# Patient Record
Sex: Male | Born: 1990 | Race: Black or African American | Hispanic: No | Marital: Single | Smoking: Current some day smoker
Health system: Southern US, Community
[De-identification: ages and names within clinical notes are randomized; demographics above are authoritative.]

---
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
## 2013-01-11 ENCOUNTER — Emergency Department: Payer: Self-pay | Admitting: Emergency Medicine

## 2013-01-14 LAB — BETA STREP CULTURE(ARMC)

## 2013-08-24 ENCOUNTER — Emergency Department: Payer: Self-pay | Admitting: Emergency Medicine

## 2014-11-26 ENCOUNTER — Encounter: Payer: Self-pay | Admitting: Emergency Medicine

## 2014-11-26 ENCOUNTER — Emergency Department
Admission: EM | Admit: 2014-11-26 | Discharge: 2014-11-26 | Disposition: A | Discharge status: Home / Self Care | Payer: Self-pay | Attending: Emergency Medicine | Admitting: Emergency Medicine

## 2014-11-26 DIAGNOSIS — Z720 Tobacco use: Secondary | ICD-10-CM | POA: Insufficient documentation

## 2014-11-26 DIAGNOSIS — L0201 Cutaneous abscess of face: Secondary | ICD-10-CM | POA: Insufficient documentation

## 2014-11-26 MED ORDER — LIDOCAINE-EPINEPHRINE 1 %-1:100000 IJ SOLN
1.3000 mL | Freq: Once | INTRAMUSCULAR | Status: DC
  Filled 2014-11-26: qty 1.3

## 2014-11-26 MED ORDER — SULFAMETHOXAZOLE-TRIMETHOPRIM 800-160 MG PO TABS
ORAL_TABLET | ORAL | Status: AC
  Administered 2014-11-26: 1 via ORAL
  Filled 2014-11-26: qty 1

## 2014-11-26 MED ORDER — LIDOCAINE-EPINEPHRINE 2 %-1:100000 IJ SOLN
INTRAMUSCULAR | Status: AC
  Filled 2014-11-26: qty 1.7

## 2014-11-26 MED ORDER — SULFAMETHOXAZOLE-TRIMETHOPRIM 800-160 MG PO TABS: 1.0000 | ORAL_TABLET | Freq: Two times a day (BID) | ORAL | Status: AC

## 2014-11-26 MED ORDER — SULFAMETHOXAZOLE-TRIMETHOPRIM 800-160 MG PO TABS
1.0000 | ORAL_TABLET | Freq: Once | ORAL | Status: AC
  Administered 2014-11-26: 1 via ORAL

## 2014-11-26 NOTE — ED Provider Notes (Signed)
CSN: [id number]     Arrival date & time 11/26/14  1908 History   First MD Initiated Contact with Patient 11/26/14 2048     Chief Complaint  Patient presents with  . Abscess     (Consider location/radiation/quality/duration/timing/severity/associated sxs/prior Treatment) HPI   24 year old male with 3-4 day history of left cheek swelling. Patient has a palpable tender mass that is red hot and swollen. He denies any drainage. No fevers numbness or tingling. No chest pain or shortness of breath or difficult swallowing. He has had left cervical anterior lymphadenopathy pain is 4 out of 10 described as a constant ache. No radiation of pain.  History reviewed. No pertinent past medical history. History reviewed. No pertinent past surgical history. No family history on file. History  Substance Use Topics  . Smoking status: Current Every Day Smoker  . Smokeless tobacco: Not on file  . Alcohol Use: No    Review of Systems  Constitutional: Negative.  Negative for fever, chills, activity change and appetite change.  HENT: Negative for congestion, ear pain, mouth sores, rhinorrhea, sinus pressure, sore throat and trouble swallowing.   Eyes: Negative for photophobia, pain and discharge.  Respiratory: Negative for cough, chest tightness and shortness of breath.   Cardiovascular: Negative for chest pain and leg swelling.  Gastrointestinal: Negative for nausea, vomiting, abdominal pain, diarrhea and abdominal distention.  Genitourinary: Negative for dysuria and difficulty urinating.  Musculoskeletal: Negative for back pain, arthralgias and gait problem.  Skin: Positive for wound (abscess left cheek). Negative for color change and rash.  Neurological: Negative for dizziness and headaches.  Hematological: Negative for adenopathy.  Psychiatric/Behavioral: Negative for behavioral problems and agitation.      Allergies  Review of patient's allergies indicates no known allergies.  Home  Medications   Prior to Admission medications   Not on File   BP 127/56 mmHg  Pulse 72  Temp(Src) 98.3 F (36.8 C) (Oral)  Resp 18  Ht 5\' 11"  (1.803 m)  Wt 137 lb (62.143 kg)  BMI 19.12 kg/m2  SpO2 99% Physical Exam  Constitutional: He is oriented to person, place, and time. He appears well-developed and well-nourished.  HENT:  Head: Normocephalic and atraumatic.  Eyes: Conjunctivae and EOM are normal. Pupils are equal, round, and reactive to light.  Neck: Normal range of motion. Neck supple.  Cardiovascular: Normal rate, regular rhythm, normal heart sounds and intact distal pulses.   Pulmonary/Chest: Effort normal and breath sounds normal. No respiratory distress. He has no wheezes. He has no rales. He exhibits no tenderness.  Abdominal: Soft. Bowel sounds are normal.  Musculoskeletal: Normal range of motion. He exhibits no edema or tenderness.  Lymphadenopathy:    He has cervical adenopathy (left).  Neurological: He is alert and oriented to person, place, and time. No cranial nerve deficit.  Skin: Skin is warm and dry. There is erythema (2 x 2 centimeter area of fluctuance that is superficial on the left cheek. No active drainage. Area of fluctuance is tender. No oral drainage or tooth pain).  Psychiatric: He has a normal mood and affect. His behavior is normal. Judgment and thought content normal.    ED Course  Procedures (including critical care time)  INCISION AND DRAINAGE Performed by: Patience MuscaGAINES, THOMAS CHRISTOPHER Consent: Verbal consent obtained. Risks and benefits: risks, benefits and alternatives were discussed Type: abscess  Body area: Left cheek  Anesthesia: local infiltration  Incision was made with a scalpel.  Local anesthetic: lidocaine 1% % with epinephrine  Anesthetic total:  1 ml  Complexity: complex Blunt dissection to break up loculations  Drainage: purulent  Drainage amount: Mild, 2 ML's   Packing material: None   Patient tolerance: Patient  tolerated the procedure well with no immediate complications.  Skin cleansed with Betadine before and after procedure  Labs Review Labs Reviewed - No data to display  Imaging Review No results found.   EKG Interpretation None      MDM   Final diagnoses:  Facial abscess    Patient with left facial abscess, superficial. Incision and drainage was performed. Patient tolerated procedure well. Moderate drainage was removed from the abscess. Patient was started on anti-biotics. Follow-up with 2-3 days for recheck.    Evon Slack, PA-C 12/25/14 [id number]  Evon Slack, PA-C 02/22/15 1205  Myrna Blazer, MD 02/22/15 1409

## 2014-11-26 NOTE — Discharge Instructions (Signed)

## 2014-11-26 NOTE — ED Notes (Signed)
Triage completed by Malachi BondsIda, RN

## 2014-11-26 NOTE — ED Notes (Signed)
Pt presents to ED with c/o abscess to left cheek. Pt reports that he began to notice swelling about two days ago. Pt is awake and alert during triage.

## 2015-09-07 ENCOUNTER — Encounter: Payer: Self-pay | Admitting: Emergency Medicine

## 2015-09-07 ENCOUNTER — Encounter: Payer: Self-pay | Admitting: Psychiatry

## 2015-09-07 ENCOUNTER — Inpatient Hospital Stay
Admission: RE | Admit: 2015-09-07 | Discharge: 2015-09-09 | DRG: 885 | Disposition: A | Discharge status: Home / Self Care | Payer: No Typology Code available for payment source | Source: Intra-hospital | Attending: Psychiatry | Admitting: Psychiatry

## 2015-09-07 ENCOUNTER — Emergency Department
Admission: EM | Admit: 2015-09-07 | Discharge: 2015-09-07 | Disposition: A | Discharge status: Other Acute Inpatient Hospital | Payer: Self-pay | Attending: Student | Admitting: Student

## 2015-09-07 DIAGNOSIS — F1210 Cannabis abuse, uncomplicated: Secondary | ICD-10-CM | POA: Insufficient documentation

## 2015-09-07 DIAGNOSIS — F332 Major depressive disorder, recurrent severe without psychotic features: Secondary | ICD-10-CM

## 2015-09-07 DIAGNOSIS — F17210 Nicotine dependence, cigarettes, uncomplicated: Secondary | ICD-10-CM | POA: Diagnosis present

## 2015-09-07 DIAGNOSIS — F32A Depression, unspecified: Secondary | ICD-10-CM

## 2015-09-07 DIAGNOSIS — F1010 Alcohol abuse, uncomplicated: Secondary | ICD-10-CM | POA: Diagnosis present

## 2015-09-07 DIAGNOSIS — R45851 Suicidal ideations: Secondary | ICD-10-CM

## 2015-09-07 DIAGNOSIS — F17200 Nicotine dependence, unspecified, uncomplicated: Secondary | ICD-10-CM | POA: Diagnosis present

## 2015-09-07 DIAGNOSIS — F329 Major depressive disorder, single episode, unspecified: Secondary | ICD-10-CM | POA: Insufficient documentation

## 2015-09-07 LAB — COMPREHENSIVE METABOLIC PANEL
ALBUMIN: 4.2 g/dL (ref 3.5–5.0)
ALT: 13 U/L — ABNORMAL LOW (ref 17–63)
ANION GAP: 7 (ref 5–15)
AST: 20 U/L (ref 15–41)
Alkaline Phosphatase: 70 U/L (ref 38–126)
BUN: 12 mg/dL (ref 6–20)
CHLORIDE: 106 mmol/L (ref 101–111)
CO2: 26 mmol/L (ref 22–32)
Calcium: 9 mg/dL (ref 8.9–10.3)
Creatinine, Ser: 0.75 mg/dL (ref 0.61–1.24)
GFR calc Af Amer: >60 mL/min (ref >60)
GFR calc non Af Amer: >60 mL/min (ref >60)
GLUCOSE: 99 mg/dL (ref 65–99)
POTASSIUM: 3.6 mmol/L (ref 3.5–5.1)
SODIUM: 139 mmol/L (ref 135–145)
Total Bilirubin: 0.9 mg/dL (ref 0.3–1.2)
Total Protein: 6.8 g/dL (ref 6.5–8.1)

## 2015-09-07 LAB — ACETAMINOPHEN LEVEL

## 2015-09-07 LAB — ETHANOL: Alcohol, Ethyl (B): <5 mg/dL (ref <5)

## 2015-09-07 LAB — URINE DRUG SCREEN, QUALITATIVE (ARMC ONLY)
AMPHETAMINES, UR SCREEN: NOT DETECTED
Barbiturates, Ur Screen: NOT DETECTED
Benzodiazepine, Ur Scrn: NOT DETECTED
Cannabinoid 50 Ng, Ur ~~LOC~~: POSITIVE — AB
Cocaine Metabolite,Ur ~~LOC~~: NOT DETECTED
MDMA (ECSTASY) UR SCREEN: NOT DETECTED
METHADONE SCREEN, URINE: NOT DETECTED
OPIATE, UR SCREEN: NOT DETECTED
PHENCYCLIDINE (PCP) UR S: NOT DETECTED
Tricyclic, Ur Screen: NOT DETECTED

## 2015-09-07 LAB — CBC
HEMATOCRIT: 42.2 % (ref 40.0–52.0)
Hemoglobin: 14.4 g/dL (ref 13.0–18.0)
MCH: 30.6 pg (ref 26.0–34.0)
MCHC: 34.1 g/dL (ref 32.0–36.0)
MCV: 89.6 fL (ref 80.0–100.0)
PLATELETS: 274 10*3/uL (ref 150–440)
RBC: 4.71 MIL/uL (ref 4.40–5.90)
RDW: 13.4 % (ref 11.5–14.5)
WBC: 6.2 10*3/uL (ref 3.8–10.6)

## 2015-09-07 LAB — TSH: TSH: 1.479 u[IU]/mL (ref 0.350–4.500)

## 2015-09-07 LAB — SALICYLATE LEVEL: Salicylate Lvl: <4 mg/dL (ref 2.8–30.0)

## 2015-09-07 MED ORDER — MIRTAZAPINE 15 MG PO TABS: 15.0000 mg | ORAL_TABLET | Freq: Every day | ORAL | Status: DC

## 2015-09-07 MED ORDER — ACETAMINOPHEN 325 MG PO TABS: 650.0000 mg | ORAL_TABLET | Freq: Four times a day (QID) | ORAL | Status: DC | PRN

## 2015-09-07 MED ORDER — MIRTAZAPINE 15 MG PO TABS
15.0000 mg | ORAL_TABLET | Freq: Every day | ORAL | Status: DC
  Administered 2015-09-07 – 2015-09-08 (×2): 15 mg via ORAL
  Filled 2015-09-07 (×2): qty 1

## 2015-09-07 MED ORDER — NICOTINE 21 MG/24HR TD PT24
21.0000 mg | MEDICATED_PATCH | Freq: Every day | TRANSDERMAL | Status: DC
  Administered 2015-09-07: 21 mg via TRANSDERMAL
  Filled 2015-09-07 (×2): qty 1

## 2015-09-07 MED ORDER — ALUM & MAG HYDROXIDE-SIMETH 200-200-20 MG/5ML PO SUSP: 30.0000 mL | ORAL | Status: DC | PRN

## 2015-09-07 MED ORDER — DIPHENHYDRAMINE HCL 25 MG PO CAPS: 50.0000 mg | ORAL_CAPSULE | Freq: Every evening | ORAL | Status: DC | PRN

## 2015-09-07 MED ORDER — DIPHENHYDRAMINE HCL 25 MG PO CAPS
50.0000 mg | ORAL_CAPSULE | Freq: Every evening | ORAL | Status: DC | PRN
  Administered 2015-09-08: 50 mg via ORAL
  Filled 2015-09-07: qty 2

## 2015-09-07 MED ORDER — MAGNESIUM HYDROXIDE 400 MG/5ML PO SUSP: 30.0000 mL | Freq: Every day | ORAL | Status: DC | PRN

## 2015-09-07 MED ORDER — CITALOPRAM HYDROBROMIDE 20 MG PO TABS: 20.0000 mg | ORAL_TABLET | Freq: Every day | ORAL | Status: DC

## 2015-09-07 NOTE — Plan of Care (Signed)
Problem: Ineffective individual coping Goal: LTG: Patient will report a decrease in negative feelings Outcome: Progressing Pt stated he was doing better due to having someone to talk to.   Problem: Diagnosis: Increased Risk For Suicide Attempt Goal: LTG-Patient Will Report Improved Mood and Deny Suicidal LTG (by discharge) Patient will report improved mood and deny suicidal ideation.  Outcome: Progressing Pt denies SI at this time

## 2015-09-07 NOTE — ED Notes (Signed)
Pt vol adm to [facility] has no c/o

## 2015-09-07 NOTE — BH Assessment (Signed)
Assessment Note  Derek Gardner is an 25 y.o. male who presents to the ER, VIA EMS, after calling them for help. Patient states, he has been depressed and having thoughts of harming his self. Patient initially stated he had not intent or plan but during the interview, he shared [place] when he started to have the thought of being hit by a train. Due to the thoughts and being afraid of what he may do, patient called 911 for help.  Patient current mood and mental state is due to the recent breakup with his girlfriend. They were together for approximately 3 years. Two days ago, they had a verbal altercation, which lead to the break up. Patient states, he hasn't talked with her since then. She hasn't returned his phone calls.  Patient is currently working and due to his current mood, it is affecting his quality of work.  Patient has no involvement with the legal system, he has no history of violence or aggression. He also reports of having no history of mental health or substance abuse treatment.    He admits to using alcohol and cannabis.   Patient denies HI and AV/H.  Diagnosis: Depression  Past Medical History: History reviewed. No pertinent past medical history.  History reviewed. No pertinent past surgical history.  Family History: History reviewed. No pertinent family history.  Social History:  reports that he has been smoking Cigarettes.  He has been smoking about 1.00 pack per day. He does not have any smokeless tobacco history on file. He reports that he uses illicit drugs (Marijuana). He reports that he does not drink alcohol.  Additional Social History:  Alcohol / Drug Use Pain Medications: See PTA Prescriptions: See PTA Over the Counter: See PTA History of alcohol / drug use?: Yes Longest period of sobriety (when/how long): "About a week." Negative Consequences of Use:  (Reports of none) Withdrawal Symptoms:  (Reports none) Substance  #1 Name of Substance 1: Cannabis 1 - Age of First Use: 15 1 - Amount (size/oz): "Maybe one or two blunts." 1 - Frequency: "May be 4" days out of the week. 1 - Duration: 9 years 1 - Last Use / Amount: 09/07/2015 Substance #2 Name of Substance 2: Alcohol 2 - Age of First Use: 14 2 - Amount (size/oz): "Like a pint of Hennessey or MJ"  2 - Frequency: 2 days out of the week 2 - Duration: "Going on two years or so." 2 - Last Use / Amount: 09/05/2015  CIWA: CIWA-Ar BP: 120/78 mmHg Pulse Rate: 71 COWS:    Allergies: No Known Allergies  Home Medications:  (Not in a hospital admission)  OB/GYN Status:  No LMP for male patient.  General Assessment Data [facility] Assessment: In system Is this a Tele or Face-to-Face Assessment?: Face-to-Face Is this an Initial Assessment or a Re-assessment for this encounter?: Initial Assessment Marital status: Single Maiden name: n/a Is patient pregnant?: No Pregnancy Status: No Living Arrangements: Parent Can pt return to current living arrangement?: Yes Admission Status: Voluntary Is patient capable of signing voluntary admission?: Yes Referral Source: Self/Family/Friend Insurance type: None  Medical Screening Exam [facility] ONLY) Medical Exam completed: Yes  Crisis Care Plan Living Arrangements: Parent Legal Guardian: Other: (None Reported) Name of Psychiatrist: None Reported Name of Therapist: None Reported  Education Status Is patient currently in school?: No Current Grade: n/a Highest grade of school patient has completed: High School Diploma  Name of school: [employer] person: n/a  Risk to self with the past 6 months Suicidal Ideation: Yes-Currently Present Has patient been a risk to self within the past 6 months prior to admission? : Yes Suicidal Intent: Yes-Currently Present Has patient had any suicidal intent within the past 6 months prior to admission? : Yes Is patient  at risk for suicide?: Yes Suicidal Plan?: Yes-Currently Present Has patient had any suicidal plan within the past 6 months prior to admission? : Yes Specify Current Suicidal Plan: Was walking on [address], with thought to get hit by a train. Access to Means: Yes Specify Access to Suicidal Means: Railroad Tracks are three blocks from his home What has been your use of drugs/alcohol within the last 12 months?: Alcohol & THC Previous Attempts/Gestures: No How many times?: 0 Other Self Harm Risks: Active Addiction Triggers for Past Attempts: None known Intentional Self Injurious Behavior: None Family Suicide History: No Recent stressful life event(s): Conflict (Comment), Other (Comment) (Recent Breakup with Girlfriend) Persecutory voices/beliefs?: No Depression: Yes Depression Symptoms: Tearfulness, Isolating, Fatigue, Guilt, Loss of interest in usual pleasures, Feeling worthless/self pity, Feeling angry/irritable Substance abuse history and/or treatment for substance abuse?: Yes Suicide prevention information given to non-admitted patients: Not applicable  Risk to Others within the past 6 months Homicidal Ideation: No Does patient have any lifetime risk of violence toward others beyond the six months prior to admission? : No Thoughts of Harm to Others: No Current Homicidal Intent: No Current Homicidal Plan: No Access to Homicidal Means: No Identified Victim: None Reported History of harm to others?: No Assessment of Violence: None Noted Violent Behavior Description: None Reported Does patient have access to weapons?: No Criminal Charges Pending?: No Does patient have a court date: No Is patient on probation?: No  Psychosis Hallucinations: None noted Delusions: None noted  Mental Status Report Appearance/Hygiene: In hospital gown, Unremarkable, In scrubs Eye Contact: Good Motor Activity: Freedom of movement, Unremarkable Speech: Logical/coherent Level of Consciousness:  Alert Mood: Depressed, Sad, Pleasant Affect: Appropriate to circumstance, Depressed, Sad Anxiety Level: None Thought Processes: Coherent, Relevant Judgement: Unimpaired Orientation: Person, Time, Place, Situation, Appropriate for developmental age Obsessive Compulsive Thoughts/Behaviors: Minimal  Cognitive Functioning Concentration: Normal Memory: Recent Intact, Remote Intact IQ: Average Insight: Fair Impulse Control: Fair Appetite: Poor Weight Loss: 0 (10 lbs) Weight Gain: 0 Sleep: No Change (Ongoing trouble with sleep.) Total Hours of Sleep: 5 (Trouble falling and staying asleep) Vegetative Symptoms: None  ADLScreening [facility]) Patient's cognitive ability adequate to safely complete daily activities?: Yes Patient able to express need for assistance with ADLs?: Yes Independently performs ADLs?: Yes (appropriate for developmental age)  Prior Inpatient Therapy Prior Inpatient Therapy: No Prior Therapy Dates: None Reported Prior Therapy Facilty/Provider(s): None Reported Reason for Treatment: None Reported  Prior Outpatient Therapy Prior Outpatient Therapy: No Prior Therapy Dates: None Reported Prior Therapy Facilty/Provider(s): None Reported Reason for Treatment: None Reported Does patient have an ACCT team?: No Does patient have Intensive In-House Services?  : No Does patient have [facility] services? : No Does patient have P4CC services?: No  ADL Screening (condition at time of admission) Patient's cognitive ability adequate to safely complete daily activities?: Yes Is the patient deaf or have difficulty hearing?: No Does the patient have difficulty seeing, even when wearing glasses/contacts?: No Does the patient have difficulty concentrating, remembering, or making decisions?: No Patient able to express need for assistance with ADLs?: Yes Does the patient have difficulty dressing or bathing?: No  Independently performs ADLs?: Yes (appropriate for  developmental age) Does the patient have difficulty walking or climbing stairs?: No Weakness of Legs: None Weakness of Arms/Hands: None  Home Assistive Devices/Equipment Home Assistive Devices/Equipment: None  Therapy Consults (therapy consults require a physician order) PT Evaluation Needed: No OT Evalulation Needed: No SLP Evaluation Needed: No Abuse/Neglect Assessment (Assessment to be complete while patient is alone) Physical Abuse: Denies Verbal Abuse: Denies Sexual Abuse: Denies Exploitation of patient/patient's resources: Denies Self-Neglect: Denies Values / Beliefs Cultural Requests During Hospitalization: None Spiritual Requests During Hospitalization: None Consults Spiritual Care Consult Needed: No Social Work Consult Needed: No Merchant navy officer (For Healthcare) Does patient have an advance directive?: No Would patient like information on creating an advanced directive?: No - patient declined information    Additional Information 1:1 In Past 12 Months?: No CIRT Risk: No Elopement Risk: No Does patient have medical clearance?: Yes  Child/Adolescent Assessment Running Away Risk: Denies (Patient is an adult)  Disposition:  Disposition Initial Assessment Completed for this Encounter: Yes Disposition of Patient: Other dispositions (ER MD ordered Psych Consult) Other disposition(s): Other (Comment) (ER MD ordered Psych Consult)  On Site Evaluation by:   Reviewed with Physician:    Lilyan Gilford MS, LCAS, LPC, NCC, CCSI Therapeutic Triage Specialist 09/07/2015 12:44 PM

## 2015-09-07 NOTE — ED Notes (Signed)
NAD noted at this time. Pt resting in bed with no acute difficulty. Pt calm and cooperative with staff at this time. 1:1 sitter D/C at this time. Will continue to monitor with 15 min safety checks at this time.

## 2015-09-07 NOTE — BH Assessment (Signed)
[facility] Dr. Toni Amendlapacs.  Attending Physician will be Dr. Jennet MaduroPucilowska.   [facility]   Intake Paper Work has been signed and placed on patient chart.  ER staff is aware of the admission Misty Stanley(Lisa, ER Sect.; Dr. Inocencio HomesGayle, ER MD; Lindie SpruceMeghan, Patient's Nurse & Ellyn HackIsabelle, Patient Access).

## 2015-09-07 NOTE — Progress Notes (Signed)
D: Pt denies SI/HI/AVH. Pt is pleasant and cooperative. Pt stated he needed someone to talk to so he came to the hospital. Pt appears top have good insight into Tx. Pt encouraged to find outside support groups and possibly get some type of mentor.   A: Pt was offered support and encouragement. Pt was given scheduled medications. Pt was encourage to attend groups. Q 15 minute checks were done for safety.   R:Pt attends groups and interacts well with peers and staff. Pt is taking medication. Pt has no complaints at this time.Pt receptive to treatment and safety maintained on unit.

## 2015-09-07 NOTE — ED Notes (Addendum)
Pt brought from main ed,pt is voluntary and to be adm to beh med he is waiting for bed assignment,he has no c/o and he is very cooperative,pt is aware of adm and is agreeable with plan

## 2015-09-07 NOTE — Plan of Care (Signed)
Problem: Ineffective individual coping Goal: STG-Increase in ability to manage activities of daily living Outcome: Progressing Insight into behavior  That lead tor admission

## 2015-09-07 NOTE — Progress Notes (Signed)
Admission Note:  D: Pt appeared depressed  With  a flat affect.  Pt  denies SI / AVH at this time.Patient went tor [employer], television[employer]film [employer]  Tracks   Upset with the break up of girl friend . Stated he did it without thinking not really  Suicidal. Pt is redirectable and cooperative with assessment.      A: Pt admitted to unit per protocol, skin assessment and search done and no contraband found.  Pt  educated on therapeutic milieu rules. Pt was introduced to milieu by nursing staff.    R: Pt was receptive to education about the milieu .  15 min safety checks started. Clinical research associatewriter offered support
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
## 2015-09-07 NOTE — Tx Team (Signed)
Initial Interdisciplinary Treatment Plan   PATIENT STRESSORS: Loss of girlfriend of 3 years Substance abuse   PATIENT STRENGTHS: Ability for insight Average or above average intelligence Communication skills Motivation for treatment/growth   PROBLEM LIST: Problem List/Patient Goals Date to be addressed Date deferred Reason deferred Estimated date of resolution  Major Depression 09/07/15     Substance/alcohol Abuse 09/07/15     Suicidal Ideation 09/07/15                                          DISCHARGE CRITERIA:  Improved stabilization in mood, thinking, and/or behavior  PRELIMINARY DISCHARGE PLAN: Outpatient therapy  PATIENT/FAMIILY INVOLVEMENT: This treatment plan has been presented to and reviewed with the patient, Derek Gardner, and/or family member.  The patient and family have been given the opportunity to ask questions and make suggestions.  [name], [name] 09/07/2015, 6:03 PM

## 2015-09-07 NOTE — ED Notes (Signed)
Per EMS pt has had thoughts of hurting himself today. Pt states since approx 0530 he has had thoughts of hurting himself. Pt states he walked out to the train tracks behind his house and sat on the train tracks and thought about not getting up and moving when a train came.
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
## 2015-09-08 DIAGNOSIS — F332 Major depressive disorder, recurrent severe without psychotic features: Principal | ICD-10-CM

## 2015-09-08 MED ORDER — MIRTAZAPINE 15 MG PO TABS: 15.0000 mg | ORAL_TABLET | Freq: Every day | ORAL | Status: DC

## 2015-09-08 MED ORDER — MIRTAZAPINE 15 MG PO TABS: 15.0000 mg | ORAL_TABLET | Freq: Every day | ORAL | Status: AC

## 2015-09-08 NOTE — Plan of Care (Signed)
Problem: [facility] Participation in Recreation Therapeutic Interventions Goal: STG-Other Recreation Therapy Goal (Specify) STG: Decision Making - Within 3 treatment sessions, patient will verbalize understanding of the decision making charts in one treatment session to increase good decision making skills.  Outcome: Completed/Met Date Met:  09/08/15 Treatment Session 1; Completed 1 out of 1: At approximately 4:05 pm, LRT met with patient in craft room. LRT educated and provided patient with decision making charts. Patient verbalized understanding. LRT encouraged patient to use the decision making charts to help him make better decisions. Intervention Used: Decision Making charts  Leonette Monarch, LRT/CTRS 03.09.17 4:27 pm  Problem: [facility] Participation in Recreation Therapeutic Interventions Goal: STG-Other Recreation Therapy Goal (Specify) STG: Stress Management - Within 3 treatment sessions, patient will verbalize understanding of the stress management techniques in one treatment session to increase stress management skills post d/c.  Outcome: Completed/Met Date Met:  09/08/15 Treatment Session 1; Completed 1 out of 1: At approximately 4:05 pm, LRT met with patient in craft room. LRT educated and provided patient with handouts on stress management techniques. Patient verbalized understanding. LRT encouraged patient to read over and practice the stress management techniques. Intervention Used: Stress Management handouts  Leonette Monarch, LRT/CTRS 03.09.17 4:29 pm

## 2015-09-08 NOTE — Plan of Care (Signed)
Problem: Alteration in mood & ability to function due to Goal: STG-Patient will report withdrawal symptoms Outcome: Progressing Reports no withdrawal symptoms

## 2015-09-08 NOTE — Progress Notes (Signed)
D:Patient focus " To return  Home to my life and job . Patient aware of discharge tomorrow .  Patient stated slept good last night .Stated appetite is good and energy level  Is normal. Stated concentration is good . Stated on Depression scale 0 , hopeless 0  and anxiety 4 .( low 0-10 high) Denies suicidal  homicidal ideations  .  No auditory hallucinations  No pain concerns . Appropriate ADL'S. Interacting with peers and staff.  A: Encourage patient participation with unit programming . Instruction  Given on  Medication , verbalize understanding. R: Voice no other concerns. Staff continue to monitor

## 2015-09-08 NOTE — [facility] Group Notes (Signed)
[facility] Group Notes:  (Nursing/MHT/Case Management/Adjunct)  Date:  09/08/2015  Time:  3:40 PM  Type of Therapy:  Psychoeducational Skills  Participation Level:  Minimal  Participation Quality:  Appropriate  Affect:  Appropriate  Cognitive:  Appropriate  Insight:  Appropriate  Engagement in Group:  Supportive  Modes of Intervention:  Activity  Summary of Progress/Problems:  Derek Gardner 09/08/2015, 3:40 PM

## 2015-09-08 NOTE — [facility] Group Notes (Signed)
[facility] Group Notes:  (Nursing/MHT/Case Management/Adjunct)  Date:  09/08/2015  Time:  12:51 AM  Type of Therapy:  Group Therapy  Participation Level:  Active  Participation Quality:  Appropriate  Affect:  Appropriate  Cognitive:  Appropriate  Insight:  Appropriate  Engagement in Group:  Engaged  Modes of Intervention:  n/a  Summary of Progress/Problems:  Veva Holesshley Imani Mosetta Ferdinand 09/08/2015, 12:51 AM

## 2015-09-08 NOTE — [facility] Suicide Risk Assessment (Signed)
[facility]   Nursing information obtained from:    Demographic factors:    Current Mental Status:    Loss Factors:    Historical Factors:    Risk Reduction Factors:     Total Time spent with patient: 1 hour Principal Problem: <principal problem not specified> Diagnosis:   Patient Active Problem List   Diagnosis Date Noted  . Severe recurrent major depression without psychotic features (HCC) [F33.2] 09/07/2015  . Alcohol use disorder, mild, abuse [F10.10] 09/07/2015  . Suicidal ideation [R45.851] 09/07/2015  . Tobacco use disorder [F17.200] 09/07/2015   Subjective Data: Depression, suicidal ideation.  Continued Clinical Symptoms:  Alcohol Use Disorder Identification Test Final Score (AUDIT): 8 The "Alcohol Use Disorders Identification Test", Guidelines for Use in Primary Care, Second Edition.  World Science writerHealth Organization [facility](WHO). Score between 0-7:  no or low risk or alcohol related problems. Score between 8-15:  moderate risk of alcohol related problems. Score between 16-19:  high risk of alcohol related problems. Score 20 or above:  warrants further diagnostic evaluation for alcohol dependence and treatment.   CLINICAL FACTORS:   Depression:   Impulsivity   Musculoskeletal: Strength & Muscle Tone: within normal limits Gait & Station: normal Patient leans: N/A  Psychiatric Specialty Exam: Review of Systems  Psychiatric/Behavioral: Positive for depression and suicidal ideas.  All other systems reviewed and are negative.   Blood pressure 126/75, pulse 56, temperature 98 F (36.7 C), temperature source Oral, resp. rate 18, height 5\' 11"  (1.803 m), weight 62.596 kg (138 lb), SpO2 100 %.Body mass index is 19.26 kg/(m^2).  General Appearance: Casual  Eye Contact::  Good  Speech:  Clear and Coherent  Volume:  Normal  Mood:  Anxious  Affect:  Appropriate  Thought Process:  Goal Directed  Orientation:  Full (Time, Place, and Person)  Thought  Content:  WDL  Suicidal Thoughts:  Yes.  with intent/plan  Homicidal Thoughts:  No  Memory:  Immediate;   Fair Recent;   Fair Remote;   Fair  Judgement:  Impaired  Insight:  Fair  Psychomotor Activity:  Normal  Concentration:  Fair  Recall:  F[employer]Fair  Fund of Knowledge:Fair  Language: Fair  Akathisia:  No  Handed:  Right  AIMS (if indicated):     Assets:  Communication Skills Desire for Improvement Financial Resources/Insurance Housing Physical Health Resilience Social Support Vocational/Educational  Sleep:  Number of Hours: 5  Cognition: WNL  ADL's:  Intact    COGNITIVE FEATURES THAT CONTRIBUTE TO RISK:  None    SUICIDE RISK:   Moderate:  Frequent suicidal ideation with limited intensity, and duration, some specificity in terms of plans, no associated intent, good self-control, limited dysphoria/symptomatology, some risk factors present, and identifiable protective factors, including available and accessible social support.  PLAN OF CARE: Hospital admission, medication management, and substance abuse counseling, discharge planning.  Mr. Derek Gardner is a 25 year old male with no past psychiatric history admitted [place] the railroad tracks in the context of relationship problems.  1. Suicidal ideation. The patient is able to contract for safety.  2. Mood. He was started on atenolol for depression.  3. Cannabis abuse. He minimizes his problems and declines treatment.  4. Smoking. Nicotine patch is available.  5. Disposition. He will be discharged to home. He will follow up [facility].  I certify that inpatient services furnished can reasonably be expected to improve the patient's condition.   Kristine LineaJolanta Jacinto Keil, MD 09/08/2015, 12:09 PM

## 2015-09-08 NOTE — Progress Notes (Signed)
Recreation Therapy Notes  INPATIENT RECREATION THERAPY ASSESSMENT  Patient Details Name: Cleda Mccreedyevin L Beougher MRN: [id number] DOB: 04/14/1991 Today's Date: 09/08/2015  Patient Stressors: Relationship, Death, Friends, Work, Other (Comment) (Recent break-up with girlfriend; aunt and grandmother died recently; lack of supportive friends; hours at work; gambling issues; can't see son as much as he would like due to lack of transportation & mother not having a phone and not getting back to him)  Coping Skills:   Isolate, Arguments, Substance Abuse, Music, Sports  Personal Challenges: Decision-Making, Expressing Yourself, Problem-Solving, Relationships, Stress Management, Time Management, Trusting Others  Leisure Interests (2+):  Games - Video games, Individual - Other (Comment) (Play disc golf)  Awareness of Community Resources:  Yes  Community Resources:  [place]  Current Use: No  If no, Barriers?: Other (Comment) (Time)  Patient Strengths:  Personality, puts other before himself  Patient Identified Areas of Improvement:  Expressing emotions  Current Recreation Participation:  Gambling  Patient Goal for Hospitalization:  Find outpatient treatment  [place]idence[place]  [place]   Current SI (including self-harm):  No  Current HI:  No  Consent to Intern Participation: N/A   Jacquelynn CreeGreene,Etherine Mackowiak M, LRT/CTRS 09/08/2015, 3:21 PM

## 2015-09-08 NOTE — Progress Notes (Signed)
Recreation Therapy Notes  Date: 03.09.17 Time: 1:00 pm Location: Craft Room  Group Topic: Leisure Education/Coping Skills  Goal Area(s) Addresses:  Patient will identify things they are grateful for.  Patient will identify how being grateful can influence decision making.  Behavioral Response: Attentive, Interactive  Intervention: Grateful Wheel  Activity: Patients were given an I Am Grateful For worksheet and encouraged to list 2-3 things they were grateful for under each category.  Education: LRT educated patient on leisure and why it is a good Associate Professorcoping skill.  Education Outcome: Acknowledges education/In group clarification offered  Clinical Observations/Feedback: Patient completed activity by writing at least one thing he was grateful for under each category. Patient contributed to group discussion by stating a common thread, why it is difficult to participate in leisure, and how being aware of what he is grateful for can guide his daily decision making.  Jacquelynn CreeGreene,Dewaine Morocho M, LRT/CTRS 09/08/2015 3:04 PM

## 2015-09-08 NOTE — [facility] Group Notes (Signed)
[facility] LCSW Group Therapy   09/08/2015 1pm   Type of Therapy: Group Therapy   Participation Level: Active   Participation Quality: Attentive, Sharing and Supportive   Affect: Appropriate   Cognitive: Alert and Oriented   Insight: Developing/Improving and Engaged   Engagement in Therapy: Developing/Improving and Engaged   Modes of Intervention: Clarification, Confrontation, Discussion, Education, Exploration, Limit-setting, Orientation, Problem-solving, Rapport Building, Dance movement psychotherapisteality Testing, Socialization and Support   Summary of Progress/Problems: The topic for group was balance in life. Today's group focused on defining balance in one's own words, identifying things that can knock one off balance, and exploring healthy ways to maintain balance in life. Group members were asked to provide an example of a time when they felt off balance, describe how they handled that situation, and process healthier ways to regain balance in the future. Group members were asked to share the most important tool for maintaining balance that [facility] and how they plan to apply this method after discharge.  Pt shared that the issues in life that he is dealing with are his recently beginning to gamble large sums of money and the relationship issues with his wife that are a result of his gambling.  Pt shared that he believes that resolving these two issues are key to his mental health recovery upon discharge.  Pt was polite and cooperative with the CSW and other group members and focused and attentive to the topics discussed and the sharing of others.  Pt shared that he realizes he gambled as a way to relieve stress in his life and attributes this to tthe fact that his brothers and "problems" with gambling, as well as his inability to cope effectively with his stressors on a daily basis..Derek Gardner

## 2015-09-08 NOTE — Tx Team (Signed)
Interdisciplinary Treatment Plan Update (Adult)  Date:  09/08/2015 Time Reviewed:  3:30 PM  Progress in Treatment: Attending groups: Yes. Participating in groups:  Yes. Taking medication as prescribed:  Yes. Tolerating medication:  Yes. Family/Significant othe contact made:  No, will contact:  mother  Patient understands diagnosis:  Yes. Discussing patient identified problems/goals with staff:  Yes. Medical problems stabilized or resolved:  Yes. Denies suicidal/homicidal ideation: Yes. Issues/concerns per patient self-inventory:  Yes. Other:  New problem(s) identified: No, Describe:  NA  Discharge Plan or Barriers: Pt plans to return home and follow up with outpatient.    Reason for Continuation of Hospitalization: Depression Medication stabilization Suicidal ideation  Comments:The patient was brought to the emergency room after aborted [place] attempt [place] the railroad tracks. He reports some symptoms of depression for the past 2 weeks in the face of severe social stressors. He has not been able to see his 79-year-old son is frequently as he would like to. He has been trying to save money to even with his girlfriend but unfortunately he lost it on gambling. This created a conflict with a girlfriend. He became increasingly depressed. This culminated in suicidal thinking after his girlfriend of 3 years broke up with him. He reportedly went to railroad trucks several times and was found sitting by a bystander who convinced him to come to the hospital. The patient reports many symptoms of depression with poor sleep and decreased appetite, anhedonia, feeling of guilt and hopelessness worthlessness, poor energy and concentration, social isolation crying spells. He reports that in the past he had passing thoughts of suicide but never attempted before. He denies psychotic symptoms or symptoms suggestive of bipolar mania. He uses cannabis denies alcohol or other illicit substance use.   Estimated  length of stay: 1-2 days   New goal(s): NA  Review of initial/current patient goals per problem list:   1.  Goal(s): Patient will participate in aftercare plan * Met:  * Target date: at discharge * As evidenced by: Patient will participate within aftercare plan AEB aftercare provider and housing plan at discharge being identified.   2.  Goal (s): Patient will exhibit decreased depressive symptoms and suicidal ideations. * Met:  *  Target date: at discharge * As evidenced by: Patient will utilize self rating of depression at 3 or below and demonstrate decreased signs of depression or be deemed stable for discharge by MD.    Attendees: Patient:  Derek Gardner 3/9/20173:30 PM  Family:   3/9/20173:30 PM  Physician:   Dr. Bary Leriche  3/9/20173:30 PM  Nursing:   Anderson Malta, RN  3/9/20173:30 PM  Case Manager:   3/9/20173:30 PM  Counselor:   3/9/20173:30 PM  Other:  [place]  3/9/20173:30 PM  Other:  Everitt Amber, [place]  3/9/20173:30 PM  Other:   3/9/20173:30 PM  Other:  3/9/20173:30 PM  Other:  3/9/20173:30 PM  Other:  3/9/20173:30 PM  Other:  3/9/20173:30 PM  Other:  3/9/20173:30 PM  Other:  3/9/20173:30 PM  Other:   3/9/20173:30 PM   Scribe for Treatment Team:   Wray Kearns, MSW, LCSWA  09/08/2015, 3:30 PM

## 2015-09-08 NOTE — H&P (Signed)
Psychiatric Admission Assessment Adult  Patient Identification: Derek Gardner MRN:  [id number] Date of Evaluation:  09/08/2015 Chief Complaint:  depression Principal Diagnosis: <principal problem not specified> Diagnosis:   Patient Active Problem List   Diagnosis Date Noted  . Severe recurrent major depression without psychotic features (HCC) [F33.2] 09/07/2015  . Alcohol use disorder, mild, abuse [F10.10] 09/07/2015  . Suicidal ideation [R45.851] 09/07/2015  . Tobacco use disorder [F17.200] 09/07/2015   History of Present Illness:  Identifying data. Derek Gardner is a 25 year old male psychiatric history.   Chief complaint. "I'm okay now."   History of present illness. Information was obtained from the patient and the chart. The patient was brought to the emergency room after aborted suicide attempt on the railroad tracks. He reports some symptoms of depression for the past 2 weeks in the face of severe social stressors. He has not been able to see his 33-year-old son is frequently as he would like to. He has been trying to save money to even with his girlfriend but unfortunately he lost it on gambling. This created a conflict with a girlfriend. He became increasingly depressed. This culminated in suicidal thinking after his girlfriend of 3 years broke up with him. He reportedly went to railroad trucks several times and was found sitting by a bystander who convinced him to come to the hospital. The patient reports many symptoms of depression with poor sleep and decreased appetite, anhedonia, feeling of guilt and hopelessness worthlessness, poor energy and concentration, social isolation crying spells. He reports that in the past he had passing thoughts of suicide but never attempted before. He denies psychotic symptoms or symptoms suggestive of bipolar mania. He uses cannabis denies alcohol or other illicit substance use.  Past psychiatric history. He was never hospitalized or taken medications.  There were no suicide attempts.  Family psychiatric history. Nonreported. He has 2 older brothers who are gamblers  Social history. He has a high school diploma but no college. He lives with his mother. He works. His girlfriend of 3 years just broke up with him but he spoke with her already that this is just a quarrel. She will visit tonight.  Total Time spent with patient: 1 hour  Past Psychiatric History: Cannabis use.   Is the patient at risk to self? Yes.    Has the patient been a risk to self in the past 6 months? No.  Has the patient been a risk to self within the distant past? No.  Is the patient a risk to others? No.  Has the patient been a risk to others in the past 6 months? No.  Has the patient been a risk to others within the distant past? No.   Prior Inpatient Therapy:   Prior Outpatient Therapy:    Alcohol Screening: 1. How often do you have a drink containing alcohol?: 2 to 4 times a month 2. How many drinks containing alcohol do you have on a typical day when you are drinking?: 3 or 4 3. How often do you have six or more drinks on one occasion?: Never Preliminary Score: 1 4. How often during the last year have you found that you were not able to stop drinking once you had started?: Less than monthly 5. How often during the last year have you failed to do what was normally expected from you becasue of drinking?: Never 6. How often during the last year have you needed a first drink in the morning to get yourself going after  a heavy drinking session?: Never 7. How often during the last year have you had a feeling of guilt of remorse after drinking?: Never 8. How often during the last year have you been unable to remember what happened the night before because you had been drinking?: Never 9. Have you or someone else been injured as a result of your drinking?: Yes, but not in the last year 10. Has a relative or friend or a doctor or another health worker been concerned about  your drinking or suggested you cut down?: Yes, but not in the last year Alcohol Use Disorder Identification Test Final Score (AUDIT): 8 Brief Intervention: Patient declined brief intervention Substance Abuse History in the last 12 months:  Yes.   Consequences of Substance Abuse: Negative Previous Psychotropic Medications: No  Psychological Evaluations: No  Past Medical History: History reviewed. No pertinent past medical history. History reviewed. No pertinent past surgical history. Family History: History reviewed. No pertinent family history. Family Psychiatric  History: None reported.  Tobacco Screening: @FLOW [phone])::1)@ Social History:  History  Alcohol Use No     History  Drug Use  . Yes  . Special: Marijuana    Comment: Last use was 09/06/2015    Additional Social History:                           Allergies:  No Known Allergies Lab Results:  Results for orders placed or performed during the hospital encounter of 09/07/15 (from the past 48 hour(s))  TSH     Status: None   Collection Time: 09/07/15 11:42 AM  Result Value Ref Range   TSH 1.479 0.350 - 4.500 uIU/mL    Blood Alcohol level:  Lab Results  Component Value Date   ETH <5 09/07/2015    Metabolic Disorder Labs:  No results found for: HGBA1C, MPG No results found for: PROLACTIN No results found for: CHOL, TRIG, HDL, CHOLHDL, VLDL, LDLCALC  Current Medications: Current Facility-Administered Medications  Medication Dose Route Frequency Provider Last Rate Last Dose  . acetaminophen (TYLENOL) tablet 650 mg  650 mg Oral Q6H PRN Audery AmelJohn T Clapacs, MD      . alum & mag hydroxide-simeth (MAALOX/MYLANTA) 200-200-20 MG/5ML suspension 30 mL  30 mL Oral Q4H PRN Audery AmelJohn T Clapacs, MD      . diphenhydrAMINE (BENADRYL) capsule 50 mg  50 mg Oral QHS PRN Audery AmelJohn T Clapacs, MD      . magnesium hydroxide (MILK OF MAGNESIA) suspension 30 mL  30 mL Oral Daily PRN Audery AmelJohn T Clapacs, MD      . mirtazapine (REMERON) tablet  15 mg  15 mg Oral QHS Audery AmelJohn T Clapacs, MD   15 mg at 09/07/15 2312  . nicotine (NICODERM CQ - dosed in mg/24 hours) patch 21 mg  21 mg Transdermal Daily Celie Desrochers B Elisia Stepp, MD   21 mg at 09/07/15 1913   PTA Medications: No prescriptions prior to admission    Musculoskeletal: Strength & Muscle Tone: within normal limits Gait & Station: normal Patient leans: N/A  Psychiatric Specialty Exam: I reviewed physical exam performed in the emergency room and agree with the findings. Physical Exam  Nursing note and vitals reviewed.   Review of Systems  Psychiatric/Behavioral: Positive for depression and suicidal ideas.  All other systems reviewed and are negative.   Blood pressure 126/75, pulse 56, temperature 98 F (36.7 C), temperature source Oral, resp. rate 18, height 5\' 11"  (1.803 m), weight 62.596 kg (138  lb), SpO2 100 %.Body mass index is 19.26 kg/(m^2).  See SRA.                                                  Sleep:  Number of Hours: 5     Treatment Plan Summary: Daily contact with patient to assess and evaluate symptoms and progress in treatment and Medication management   Mr. Thayer is a 25 year old male with no past psychiatric history admitted for aborted suicide attempt on the railroad tracks in the context of relationship problems.  1. Suicidal ideation. The patient is able to contract for safety.  2. Mood. He was started on atenolol for depression.  3. Cannabis abuse. He minimizes his problems and declines treatment.  4. Smoking. Nicotine patch is available.  5. Disposition. He will be discharged to home. He will follow up [facility].  Observation Level/Precautions:  15 minute checks  Laboratory:  CBC Chemistry Profile UDS UA  Psychotherapy:    Medications:    Consultations:    Discharge Concerns:    Estimated LOS:  Other:     I certify that inpatient services furnished can reasonably be expected to improve the patient's condition.     Kristine Linea, MD 3/9/201712:13 PM

## 2015-09-09 NOTE — Discharge Summary (Signed)
Pt is alert and oriented.  Denies si or hi.  Denies access to weapons at home.  Pt's belongings returned to him along with a 7 day supply of medication. Transported home by mother.   No distress noted.

## 2015-09-09 NOTE — Discharge Summary (Signed)
Physician Discharge Summary Note  Patient:  Derek Gardner is an 25 y.o., male MRN:  [id number] DOB:  12/31/1990 Patient phone:  [phone] (home)  Patient address:   [address] [place] [address],  Total Time spent with patient: 30 minutes  Date of Admission:  09/07/2015 Date of Discharge: 09/09/2015  Reason for Admission:  Aborted suicide attempt.  Identifying data. Derek Gardner is a 25 year old male psychiatric history.   Chief complaint. "I'm okay now."   History of present illness. Information was obtained from the patient and the chart. The patient was brought to the emergency room after aborted suicide attempt on the railroad tracks. He reports some symptoms of depression for the past 2 weeks in the face of severe social stressors. He has not been able to see his 61-year-old son is frequently as he would like to. He has been trying to save money to even with his girlfriend but unfortunately he lost it on gambling. This created a conflict with a girlfriend. He became increasingly depressed. This culminated in suicidal thinking after his girlfriend of 3 years broke up with him. He reportedly went to railroad trucks several times and was found sitting by a bystander who convinced him to come to the hospital. The patient reports many symptoms of depression with poor sleep and decreased appetite, anhedonia, feeling of guilt and hopelessness worthlessness, poor energy and concentration, social isolation crying spells. He reports that in the past he had passing thoughts of suicide but never attempted before. He denies psychotic symptoms or symptoms suggestive of bipolar mania. He uses cannabis denies alcohol or other illicit substance use.  Past psychiatric history. He was never hospitalized or taken medications. There were no suicide attempts.  Family psychiatric history. Nonreported. He has 2 older brothers who are gamblers  Social history. He has a high school diploma but no college. He  lives with his mother. He works. His girlfriend of 3 years just broke up with him but he spoke with her already that this is just a quarrel. She will visit tonight.  Principal Problem: Severe recurrent major depression without psychotic features [facility]) Discharge Diagnoses: Patient Active Problem List   Diagnosis Date Noted  . Severe recurrent major depression without psychotic features (HCC) [F33.2] 09/07/2015  . Alcohol use disorder, mild, abuse [F10.10] 09/07/2015  . Suicidal ideation [R45.851] 09/07/2015  . Tobacco use disorder [F17.200] 09/07/2015    Past Psychiatric History: Cannabis use.  Past Medical History: History reviewed. No pertinent past medical history. History reviewed. No pertinent past surgical history. Family History: History reviewed. No pertinent family history. Family Psychiatric  History: None reported. Social History:  History  Alcohol Use No     History  Drug Use  . Yes  . Special: Marijuana    Comment: Last use was 09/06/2015    Social History   Social History  . Marital Status: Single    Spouse Name: N/A  . Number of Children: N/A  . Years of Education: N/A   Social History Main Topics  . Smoking status: Current Every Day Smoker -- 1.00 packs/day    Types: Cigarettes  . Smokeless tobacco: None  . Alcohol Use: No  . Drug Use: Yes    Special: Marijuana     Comment: Last use was 09/06/2015  . Sexual Activity: Not Asked   Other Topics Concern  . None   Social History Narrative    Hospital Course:    Derek Gardner is a 25 year old male with no past  psychiatric history admitted for aborted suicide attempt on the railroad tracks in the context of relationship problems.  1. Suicidal ideation. This has resolved. The patient is able to contract for safety. He is forward thinking and optimistic about the future  2. Mood. He was started on Remeron for depression.  3. Cannabis abuse. He minimizes his problems and declines treatment.  4. Smoking.  Nicotine patch was available.  5. Disposition. He was discharged to home with family. He will follow up [facility].  Physical Findings: AIMS:  , ,  ,  ,    CIWA:    COWS:     Musculoskeletal: Strength & Muscle Tone: within normal limits Gait & Station: normal Patient leans: N/A  Psychiatric Specialty Exam: Review of Systems  All other systems reviewed and are negative.   Blood pressure 116/74, pulse 48, temperature 98.1 F (36.7 C), temperature source Oral, resp. rate 18, height 5\' 11"  (1.803 m), weight 62.596 kg (138 lb), SpO2 100 %.Body mass index is 19.26 kg/(m^2).  See SRA.                                                  Sleep:  Number of Hours: 5.75   Have you used any form of tobacco in the last 30 days? (Cigarettes, Smokeless Tobacco, Cigars, and/or Pipes): Yes  Has this patient used any form of tobacco in the last 30 days? (Cigarettes, Smokeless Tobacco, Cigars, and/or Pipes) Yes, Yes, A prescription for an FDA-approved tobacco cessation medication was offered at discharge and the patient refused  Blood Alcohol level:  Lab Results  Component Value Date   ETH <5 09/07/2015    Metabolic Disorder Labs:  No results found for: HGBA1C, MPG No results found for: PROLACTIN No results found for: CHOL, TRIG, HDL, CHOLHDL, VLDL, LDLCALC  See Psychiatric Specialty Exam and Suicide Risk Assessment completed by Attending Physician prior to discharge.  Discharge destination:  Home  Is patient on multiple antipsychotic therapies at discharge:  No   Has Patient had three or more failed trials of antipsychotic monotherapy by history:  No  Recommended Plan for Multiple Antipsychotic Therapies: NA  Discharge Instructions    Diet - low sodium heart healthy    Complete by:  As directed      Increase activity slowly    Complete by:  As directed             Medication List    TAKE these medications      Indication   mirtazapine 15 MG tablet  Commonly  known as:  REMERON  Take 1 tablet (15 mg total) by mouth at bedtime.   Indication:  Major Depressive Disorder         Follow-up recommendations:  Activity:  As tolerated. Diet:  Regular. Other:  Keep follow-up appointments.  Comments:    Signed: Kristine LineaJolanta Keefer Soulliere, MD 09/09/2015, 9:54 AM

## 2015-09-09 NOTE — Progress Notes (Signed)
D: Observed pt interacting in day room. Patient alert and oriented x4. Patient denies SI/HI/AVH. Pt affect is appropriate to circumstance. Pt is pleasant and cheerful. Pt stated his mood is "pretty great." Pt talked about the positive experience of meeting peers and "watching people grow." Pt stated he was "ready to go home." Pt had no complaints. A: Offered active listening and support. Provided therapeutic communication. Administered scheduled medications. Encouraged pt to follow medication regimen post discharge. R: Pt pleasant and cooperative. Pt medication compliant. Will continue Q15 min. checks. Safety maintained.

## 2015-09-09 NOTE — [facility] Suicide Risk Assessment (Signed)
[facility] INPATIENT:  Family/Significant Other Suicide Prevention Education  Suicide Prevention Education:  Education Completed;Derek AcresSherry Student [phone] (mother) has been identified by the patient as the family member/significant other with whom the patient will be residing, and identified as the person(s) who will aid the patient in the event of a mental health crisis (suicidal ideations/suicide attempt).  With written consent from the patient, the family member/significant other has been provided the following suicide prevention education, prior to the and/or following the discharge of the patient.  The suicide prevention education provided includes the following:  Suicide risk factors  Suicide prevention and interventions  National Suicide Hotline telephone number  [facility] assessment telephone number  [facility] Emergency Assistance 911  [facility] and/or Residential Mobile Crisis Unit telephone number  Request made of family/significant other to:  Remove weapons (e.g., guns, rifles, knives), all items previously/currently identified as safety concern.    Remove drugs/medications (over-the-counter, prescriptions, illicit drugs), all items previously/currently identified as a safety concern.  The family member/significant other verbalizes understanding of the suicide prevention education information provided.  The family member/significant other agrees to remove the items of safety concern listed above.  Derek Gardner L Derek Gardner MSW, LCSWA  09/09/2015, 11:36 AM

## 2015-09-09 NOTE — Progress Notes (Signed)
  [facility] Dis[facility] :  Will you be returning to the same living situation after discharge:  Yes,  home At discharge, do you have transportation home?: Yes,  family  Do you have the ability to pay for your medications: Yes,  [facility]  Release of information consent forms completed and in the chart;  Patient's signature needed at discharge.  Patient to Follow up at: Follow-up Information    [facility]   Why:  Your hospital follow up appointment will be walk in. Walk in hours are Monday- Friday 8:00 am - 10:00am. Please call Lorella NimrodHarvey once you are discharged. [phone].    Contact information:   [address] [place] [address] [phone]       Next level of care provider has access to [facility] Link:no  Safety Planning and Suicide Prevention discussed: Yes,  with patient and mother   Have you used any form of tobacco in the last 30 days? (Cigarettes, Smokeless Tobacco, Cigars, and/or Pipes): Yes  Has patient been referred to the Quitline?: Patient refused referral  Patient has been referred for addiction treatment: Pt. refused referral  Rondall Allegraandace L Nicoletta Hush MSW, LCSWA  09/09/2015, 11:37 AM

## 2015-09-09 NOTE — B[facility] Counselor (Signed)
Adult Comprehensive Assessment  Patient ID: Derek Gardner, male   DOB: 12/11/1990, 25 y.o.   MRN: [id number]  Information Source: Information source: Patient  Current Stressors:  Educational / Learning stressors: None reported  Employment / Job issues: Pt is employed at [employer].  Family Relationships: None reported  Financial / Lack of resources (include bankruptcy): Limited income.  Housing / Lack of housing: Pt lives with parents  Physical health (include injuries & life threatening diseases): None reported  Social relationships: None reported  Substance abuse: Pt reports smoking marjuana 3x per week and drinking alcohol on weekends.  Bereavement / Loss: Pt's girlfriend broke up with him.   Living/Environment/Situation:  Living Arrangements: Parent Living conditions (as described by patient or guardian): Good but would like to be independent again How long has patient lived in current situation?: 6 months  What is atmosphere in current home: Comfortable, ParamedicLoving, Supportive  Family History:  Marital status: Single Are you sexually active?: Yes What is your sexual orientation?: Heterosexual  Has your sexual activity been affected by drugs, alcohol, medication, or emotional stress?: None reported  Does patient have children?: Yes How many children?: 1 How is patient's relationship with their children?: 25 year old son, wishes he could see him more often.  Childhood History:  By whom was/is the patient raised?: Both parents Description of patient's relationship with caregiver when they were a child: good relationship with parents  Patient's description of current relationship with people who raised him/her: good relationship with parents  Does patient have siblings?: Yes Number of Siblings: 4 Description of patient's current relationship with siblings: Close relationship with 3 siblings, distant relationship with 1.  Did patient suffer any verbal/emotional/physical/sexual abuse  as a child?: No Did patient suffer from severe childhood neglect?: No Has patient ever been sexually abused/assaulted/raped as an adolescent or adult?: No Was the patient ever a victim of a crime or a disaster?: No Witnessed domestic violence?: No Has patient been effected by domestic violence as an adult?: No  Education:  Highest grade of school patient has completed: Geneticist, molecularHigh School Diploma Currently a student?: No Learning disability?: No  Employment/Work Situation:   Employment situation: Employed Where is patient currently employed?: [employer].  How long has patient been employed?: 3 months  What is the longest time patient has a held a job?: over 1 year  Where was the patient employed at that time?: fast food  Has patient ever been in the [employer] and Companymilitary?: No  Financial Resources:   Financial resources: Income from employment, Support from parents / caregiver Does patient have a representative payee or guardian?: No  Alcohol/Substance Abuse:   What has been your use of drugs/alcohol within the last 12 months?: Pt reports using marijuana 3x per week and alcohol on weekends.  Alcohol/Substance Abuse Treatment Hx: Denies past history Has alcohol/substance abuse ever caused legal problems?: No  Social Support System:   Patient's Community Support System: Good Describe Community Support System: family  Type of faith/religion: NA How does patient's faith help to cope with current illness?: NA  Leisure/Recreation:   Leisure and Hobbies: disc golf, watching tv shows, playing pool   Strengths/Needs:   What things does the patient do well?: disc golf, sociable.  In what areas does patient struggle / problems for patient: recent break up, finanical issues.   Discharge Plan:   Does patient have access to transportation?: Yes Will patient be returning to same living situation after discharge?: Yes Currently receiving community mental health services:  No If no, would patient like referral  for services when discharged?: Yes (What [place] ) Does patient have financial barriers related to discharge medications?: Yes Patient description of barriers related to discharge medications: No insurance, no income.   Summary/Recommendations:    Patient is a 25 year old male admitted  with a diagnosis of Major Depression. Patient presented to the hospital with SI and depression. Patient reports primary triggers for admission were end of a relationship. Patient will benefit from crisis stabilization, medication evaluation, group therapy and psycho education in addition to case management for discharge. At discharge, it is recommended that patient remain compliant with established discharge plan and continued treatment.   Derek Gardner. MSW, LCSWA  09/09/2015

## 2015-09-09 NOTE — [facility] Suicide Risk Assessment (Signed)
[facility]   Principal Problem: Severe recurrent major depression without psychotic features [facility](HCC) Discharge Diagnoses:  Patient Active Problem List   Diagnosis Date Noted  . Severe recurrent major depression without psychotic features (HCC) [F33.2] 09/07/2015  . Alcohol use disorder, mild, abuse [F10.10] 09/07/2015  . Suicidal ideation [R45.851] 09/07/2015  . Tobacco use disorder [F17.200] 09/07/2015    Total Time spent with patient: 30 minutes  Musculoskeletal: Strength & Muscle Tone: within normal limits Gait & Station: normal Patient leans: N/A  Psychiatric Specialty Exam: Review of Systems  All other systems reviewed and are negative.   Blood pressure 116/74, pulse 48, temperature 98.1 F (36.7 C), temperature source Oral, resp. rate 18, height 5\' 11"  (1.803 m), weight 62.596 kg (138 lb), SpO2 100 %.Body mass index is 19.26 kg/(m^2).  General Appearance: Casual  Eye Contact::  Good  Speech:  Clear and Coherent409  Volume:  Normal  Mood:  Euthymic  Affect:  Appropriate  Thought Process:  Goal Directed  Orientation:  Full (Time, Place, and Person)  Thought Content:  WDL  Suicidal Thoughts:  No  Homicidal Thoughts:  No  Memory:  Immediate;   Fair Recent;   Fair Remote;   Fair  Judgement:  Fair  Insight:  Fair  Psychomotor Activity:  Normal  Concentration:  Fair  Recall:  FiservFair  Fund of Knowledge:Fair  Language: Fair  Akathisia:  No  Handed:  Right  AIMS (if indicated):     Assets:  Communication Skills Desire for Improvement Housing Physical Health Resilience Social Support  Sleep:  Number of Hours: 5.75  Cognition: WNL  ADL's:  Intact   Mental Status Per Nursing Assessment::   On Admission:     Demographic Factors:  Male and Adolescent or young adult  Loss Factors: Loss of significant relationship  Historical Factors: Impulsivity  Risk Reduction Factors:   Sense of responsibility to family, Employed, Living with  another person, especially a relative and Positive social support  Continued Clinical Symptoms:  Depression:   Comorbid alcohol abuse/dependence Impulsivity  Cognitive Features That Contribute To Risk:  None    Suicide Risk:  Minimal: No identifiable suicidal ideation.  Patients presenting with no risk factors but with morbid ruminations; may be classified as minimal risk based on the severity of the depressive symptoms    Plan Of Care/Follow-up recommendations:  Activity:  As tolerated. Diet:  Regular. Other:  Keep follow-up appointments.  Kristine LineaJolanta Tena Linebaugh, MD 09/09/2015, 9:51 AM

## 2015-09-09 NOTE — Plan of Care (Signed)
Problem: Diagnosis: Increased Risk For Suicide Attempt Goal: LTG-Patient Will Report Improved Mood and Deny Suicidal LTG (by discharge) Patient will report improved mood and deny suicidal ideation.  Outcome: Progressing Pt denies SI. Pt denies feeling depressed or anxious.

## 2015-09-09 NOTE — [facility] Group Notes (Signed)
[facility] Group Notes:  (Nursing/MHT/Case Management/Adjunct)  Date:  09/09/2015  Time:  12:22 PM  Type of Therapy:  Psychoeducational Skills  Participation Level:  Active  Participation Quality:  Appropriate  Affect:  Appropriate  Cognitive:  Appropriate  Insight:  Appropriate  Engagement in Group:  Engaged  Modes of Intervention:  Activity  Summary of Progress/Problems:  Derek Farberamela M Moses Gardner 09/09/2015, 12:22 PM

## 2015-09-09 NOTE — Progress Notes (Signed)
Recreation Therapy Notes  INPATIENT RECREATION TR PLAN  Patient Details Name: KAYCE BETTY MRN: [id number] DOB: 1991-04-05 Today's Date: 09/09/2015  Rec Therapy Plan Is patient appropriate for Therapeutic Recreation?: Yes Treatment times per week: At least once a week TR Treatment/Interventions: 1:1 session, Group participation (Comment) (Appropriate participation in daily recreation therapy tx)  Discharge Criteria Pt will be discharged from therapy if:: Treatment goals are met, Discharged Treatment plan/goals/alternatives discussed and agreed upon by:: Patient/family  Discharge Summary Short term goals set: See Care Plan Short term goals met: Complete Progress toward goals comments: One-to-one attended Which groups?: Coping skills, Leisure education One-to-one attended: Decision making, stress management Reason goals not met: N/A Therapeutic equipment acquired: None Reason patient discharged from therapy: Discharge from hospital Pt/family agrees with progress & goals achieved: Yes Date patient discharged from therapy: 09/09/15   Leonette Monarch, LRT/CTRS 09/09/2015, 4:26 PM

## 2015-09-09 NOTE — [facility] Group Notes (Signed)
[facility] Group Notes:  (Nursing/MHT/Case Management/Adjunct)  Date:  09/09/2015  Time:  1:54 AM  Type of Therapy:  Group Therapy  Participation Level:  Minimal  Participation Quality:  Appropriate  Affect:  Appropriate  Cognitive:  Appropriate  Insight:  Good  Engagement in Group:  Engaged  Modes of Intervention:  n/a  Summary of Progress/Problems:  Derek Gardner 09/09/2015, 1:54 AM

## 2016-05-01 ENCOUNTER — Emergency Department: Payer: No Typology Code available for payment source

## 2016-05-01 ENCOUNTER — Encounter: Payer: Self-pay | Admitting: Emergency Medicine

## 2016-05-01 ENCOUNTER — Emergency Department
Admission: EM | Admit: 2016-05-01 | Discharge: 2016-05-01 | Disposition: A | Discharge status: Home / Self Care | Payer: No Typology Code available for payment source | Attending: Emergency Medicine | Admitting: Emergency Medicine

## 2016-05-01 DIAGNOSIS — F17210 Nicotine dependence, cigarettes, uncomplicated: Secondary | ICD-10-CM | POA: Insufficient documentation

## 2016-05-01 DIAGNOSIS — S199XXA Unspecified injury of neck, initial encounter: Secondary | ICD-10-CM | POA: Diagnosis present

## 2016-05-01 DIAGNOSIS — S0081XA Abrasion of other part of head, initial encounter: Secondary | ICD-10-CM | POA: Diagnosis not present

## 2016-05-01 DIAGNOSIS — Y92410 Unspecified street and highway as the place of occurrence of the external cause: Secondary | ICD-10-CM | POA: Diagnosis not present

## 2016-05-01 DIAGNOSIS — Z23 Encounter for immunization: Secondary | ICD-10-CM | POA: Insufficient documentation

## 2016-05-01 DIAGNOSIS — Y999 Unspecified external cause status: Secondary | ICD-10-CM | POA: Insufficient documentation

## 2016-05-01 DIAGNOSIS — S161XXA Strain of muscle, fascia and tendon at neck level, initial encounter: Secondary | ICD-10-CM | POA: Diagnosis not present

## 2016-05-01 DIAGNOSIS — R51 Headache: Secondary | ICD-10-CM | POA: Insufficient documentation

## 2016-05-01 DIAGNOSIS — Y939 Activity, unspecified: Secondary | ICD-10-CM | POA: Insufficient documentation

## 2016-05-01 MED ORDER — OXYCODONE HCL 5 MG PO TABS
5.0000 mg | ORAL_TABLET | Freq: Once | ORAL | Status: AC
  Administered 2016-05-01: 5 mg via ORAL
  Filled 2016-05-01: qty 1

## 2016-05-01 MED ORDER — ACETAMINOPHEN 500 MG PO TABS
1000.0000 mg | ORAL_TABLET | Freq: Once | ORAL | Status: AC
  Administered 2016-05-01: 1000 mg via ORAL
  Filled 2016-05-01: qty 2

## 2016-05-01 MED ORDER — TETANUS-DIPHTH-ACELL PERTUSSIS 5-2.5-18.5 LF-MCG/0.5 IM SUSP
0.5000 mL | Freq: Once | INTRAMUSCULAR | Status: AC
  Administered 2016-05-01: 0.5 mL via INTRAMUSCULAR
  Filled 2016-05-01: qty 0.5
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
## 2016-05-12 ENCOUNTER — Emergency Department
Admission: EM | Admit: 2016-05-12 | Discharge: 2016-05-12 | Disposition: A | Discharge status: Home / Self Care | Payer: No Typology Code available for payment source | Attending: Emergency Medicine | Admitting: Emergency Medicine

## 2016-05-12 ENCOUNTER — Encounter: Payer: Self-pay | Admitting: Emergency Medicine

## 2016-05-12 DIAGNOSIS — F1290 Cannabis use, unspecified, uncomplicated: Secondary | ICD-10-CM | POA: Insufficient documentation

## 2016-05-12 DIAGNOSIS — Z041 Encounter for examination and observation following transport accident: Secondary | ICD-10-CM | POA: Insufficient documentation

## 2016-05-12 DIAGNOSIS — Z043 Encounter for examination and observation following other accident: Secondary | ICD-10-CM

## 2016-05-12 DIAGNOSIS — F17210 Nicotine dependence, cigarettes, uncomplicated: Secondary | ICD-10-CM | POA: Insufficient documentation

## 2016-05-12 DIAGNOSIS — Z79899 Other long term (current) drug therapy: Secondary | ICD-10-CM | POA: Diagnosis not present
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
## 2016-05-12 NOTE — ED Triage Notes (Signed)
[facility]. Wishes to be rechecked and cleared to return to work on Monday. Denies pain.

## 2016-05-31 ENCOUNTER — Emergency Department
Admission: EM | Admit: 2016-05-31 | Discharge: 2016-05-31 | Disposition: A | Discharge status: Home / Self Care | Payer: Self-pay | Attending: Student in an Organized Health Care Education/Training Program | Admitting: Student in an Organized Health Care Education/Training Program

## 2016-05-31 DIAGNOSIS — F17210 Nicotine dependence, cigarettes, uncomplicated: Secondary | ICD-10-CM | POA: Insufficient documentation

## 2016-05-31 DIAGNOSIS — H0012 Chalazion right lower eyelid: Secondary | ICD-10-CM | POA: Insufficient documentation

## 2016-05-31 DIAGNOSIS — H0015 Chalazion left lower eyelid: Secondary | ICD-10-CM | POA: Insufficient documentation

## 2016-05-31 MED ORDER — ERYTHROMYCIN 5 MG/GM OP OINT: 1.0000 "application " | TOPICAL_OINTMENT | Freq: Four times a day (QID) | OPHTHALMIC | 0 refills | Status: AC

## 2016-05-31 NOTE — ED Provider Notes (Signed)
[facility] Emergency Department Provider Note  ____________________________________________  Time seen: Approximately 2:47 PM  I have reviewed the triage vital signs and the nursing notes.   HISTORY  Chief Complaint Eye Problem    HPI Derek Gardner is a 25 y.o. male, NAD, who presents to the emergency department with a one week history of bilateral styes. Patient reports that he has chronically gotten styes for years, but these have not gone away with warm compresses as they have in the past. He denies any vision changes, itching, discharge, eye pain, fever, or headache. Has had no injury or trauma to the eyes. He does not currently wear contacts or glasses. He has not seen opthalmology for this issue.    History reviewed. No pertinent past medical history.  Patient Active Problem List   Diagnosis Date Noted  . Severe recurrent major depression without psychotic features (HCC) 09/07/2015  . Alcohol use disorder, mild, abuse 09/07/2015  . Suicidal ideation 09/07/2015  . Tobacco use disorder 09/07/2015    History reviewed. No pertinent surgical history.  Prior to Admission medications   Medication Sig Start Date End Date Taking? Authorizing Provider  erythromycin ophthalmic ointment Place 1 application into the left eye 4 (four) times daily. Apply 1cm ribbon to lower eyelid 4 times daily. 05/31/16   Ziana Heyliger L Voncile Schwarz, PA-C  mirtazapine (REMERON) 15 MG tablet Take 1 tablet (15 mg total) by mouth at bedtime. 09/08/15   Shari ProwsJolanta B Pucilowska, MD    Allergies Patient has no known allergies.  No family history on file.  Social History Social History  Substance Use Topics  . Smoking status: Current Every Day Smoker    Packs/day: 1.00    Types: Cigarettes  . Smokeless tobacco: Never Used  . Alcohol use No     Review of Systems  Constitutional: No fever Eyes: Positive styes on bilateral lower eyelids. No visual changes. No discharge, itching, or eye  pain. ENT: No sore throat, nasal congestion, runny nose  Skin: Negative for rash, redness, oozing, weeping. Neurological: Negative for headaches.  ____________________________________________   PHYSICAL EXAM:  VITAL SIGNS: ED Triage Vitals  Enc Vitals Group     BP 05/31/16 1420 134/68     Pulse Rate 05/31/16 1420 75     Resp 05/31/16 1420 20     Temp 05/31/16 1420 98.6 F (37 C)     Temp Source 05/31/16 1420 Oral     SpO2 05/31/16 1420 98 %     Weight 05/31/16 1420 140 lb (63.5 kg)     Height 05/31/16 1420 5\' 11"  (1.803 m)     Head Circumference --      Peak Flow --      Pain Score 05/31/16 1427 0     Pain Loc --      Pain Edu? --      Excl. in GC? --      Constitutional: Alert and oriented. Well appearing and in no acute distress. Eyes: Conjunctivae are normal without icterus, injection or discharge. PERRLA. EOMI without pain. 0.5 cm erythematous and tender stye on left lower lid. No active oozing, weeping. Small style on right lower eye lid without erythema or tenderness.  Head: Atraumatic. Neck: No stridor. Supple with full range of motion. Hematological/Lymphatic/Immunilogical: No cervical lymphadenopathy. Cardiovascular: Good peripheral circulation. Respiratory: Normal respiratory effort without tachypnea or retractions.  Neurologic:  Normal speech and language. No gross focal neurologic deficits are appreciated.  Skin:  Skin is warm, dry and  intact. No rash noted. Psychiatric: Mood and affect are normal. Speech and behavior are normal. Patient exhibits appropriate insight and judgement.   ____________________________________________   LABS  None  ____________________________________________  EKG  None ____________________________________________  RADIOLOGY  None ____________________________________________    PROCEDURES  Procedure(s) performed: None   Procedures   Medications - No data to  display   ____________________________________________   INITIAL IMPRESSION / ASSESSMENT AND PLAN / ED COURSE  Pertinent labs & imaging results that were available during my care of the patient were reviewed by me and considered in my medical decision making (see chart for details).  Clinical Course     Patient's diagnosis is consistent with chalazions of right and left eye. Patient will be discharged home with prescriptions for erythromycin ointment to use as prescribed. Patient is to follow up with Dr. Brooke DareKing in opthalmology for further management. Patient is given ED precautions to return to the ED for any worsening or new symptoms.    ____________________________________________  FINAL CLINICAL IMPRESSION(S) / ED DIAGNOSES  Final diagnoses:  Chalazion of left lower eyelid  Chalazion of right lower eyelid      NEW MEDICATIONS STARTED DURING THIS VISIT:  New Prescriptions   ERYTHROMYCIN OPHTHALMIC OINTMENT    Place 1 application into the left eye 4 (four) times daily. Apply 1cm ribbon to lower eyelid 4 times daily.         Hope PigeonJami L Amal Saiki, PA-C 05/31/16 1528    Willy EddyPatrick Robinson, MD 05/31/16 [phone]

## 2016-05-31 NOTE — ED Triage Notes (Signed)
Pt c/o BL sty, c/o pain with them for the past week..Marland Kitchen
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
## 2023-12-10 ENCOUNTER — Encounter: Payer: Self-pay | Admitting: Emergency Medicine

## 2023-12-10 ENCOUNTER — Other Ambulatory Visit: Payer: Self-pay

## 2023-12-10 ENCOUNTER — Emergency Department
Admission: EM | Admit: 2023-12-10 | Discharge: 2023-12-10 | Disposition: A | Discharge status: Home / Self Care | Payer: Self-pay | Attending: Emergency Medicine | Admitting: Emergency Medicine

## 2023-12-10 DIAGNOSIS — K0889 Other specified disorders of teeth and supporting structures: Secondary | ICD-10-CM

## 2023-12-10 DIAGNOSIS — F17200 Nicotine dependence, unspecified, uncomplicated: Secondary | ICD-10-CM | POA: Insufficient documentation

## 2023-12-10 DIAGNOSIS — K029 Dental caries, unspecified: Secondary | ICD-10-CM | POA: Insufficient documentation

## 2023-12-10 MED ORDER — CHLORHEXIDINE GLUCONATE 0.12 % MT SOLN: 15.0000 mL | Freq: Two times a day (BID) | OROMUCOSAL | 0 refills | Status: AC

## 2023-12-10 MED ORDER — AMOXICILLIN-POT CLAVULANATE 875-125 MG PO TABS: 1.0000 | ORAL_TABLET | Freq: Two times a day (BID) | ORAL | 0 refills | Status: AC

## 2023-12-10 NOTE — ED Triage Notes (Signed)
 Pt here with a dental abscess, top left. Pt has a cracked tooth. Pt denies fevers, N, V, and D. Pt stable in triage.

## 2023-12-10 NOTE — Discharge Instructions (Signed)
 You may take the antibiotic as prescribed.  Please follow-up with a dentist as soon as possible.  Please return for any new, worsening, or change in symptoms or other concerns.

## 2023-12-10 NOTE — ED Provider Notes (Signed)
 [facility] Provider Note    Event Date/Time   First MD Initiated Contact with Patient 12/10/23 1441     (approximate)   History   Abscess   HPI  Derek Gardner is a 33 y.o. male who presents today for evaluation of left upper dental pain.  Patient reports that this began yesterday.  He has not noticed any facial or neck swelling or redness.  He has not had any drooling or voice changes.  No neck pain or stiffness.  No fevers or chills.  Patient Active Problem List   Diagnosis Date Noted   Severe recurrent major depression without psychotic features (HCC) 09/07/2015   Alcohol use disorder, mild, abuse 09/07/2015   Suicidal ideation 09/07/2015   Tobacco use disorder 09/07/2015          Physical Exam   Triage Vital Signs: ED Triage Vitals  Encounter Vitals Group     BP 12/10/23 1423 119/79     Systolic BP Percentile --      Diastolic BP Percentile --      Pulse Rate 12/10/23 1422 75     Resp 12/10/23 1422 17     Temp 12/10/23 1422 98.7 F (37.1 C)     Temp Source 12/10/23 1422 Oral     SpO2 12/10/23 1422 99 %     Weight 12/10/23 1423 139 lb 15.9 oz (63.5 kg)     Height 12/10/23 1423 5\' 11"  (1.803 m)     Head Circumference --      Peak Flow --      Pain Score 12/10/23 1423 10     Pain Loc --      Pain Education --      Exclude from Growth Chart --     Most recent vital signs: Vitals:   12/10/23 1422 12/10/23 1423  BP:  119/79  Pulse: 75   Resp: 17   Temp: 98.7 F (37.1 C)   SpO2: 99%     Physical Exam Vitals and nursing note reviewed.  Constitutional:      General: Awake and alert. No acute distress.    Appearance: Normal appearance. The patient is normal weight.  HENT:     Head: Normocephalic and atraumatic.     Mouth: Mucous membranes are moist.  Missing tooth #14 with obvious decay.  No gingival fluctuance.  No sublingual swelling or fluctuance.  No drooling.  No trismus Eyes:     General: PERRL. Normal EOMs         Right eye: No discharge.        Left eye: No discharge.     Conjunctiva/sclera: Conjunctivae normal.  Cardiovascular:     Rate and Rhythm: Normal rate and regular rhythm.     Pulses: Normal pulses.  Pulmonary:     Effort: Pulmonary effort is normal. No respiratory distress.     Breath sounds: Normal breath sounds.  Abdominal:     Abdomen is soft. There is no abdominal tenderness. No rebound or guarding. No distention. Musculoskeletal:        General: No swelling. Normal range of motion.     Cervical back: Normal range of motion and neck supple.  Skin:    General: Skin is warm and dry.     Capillary Refill: Capillary refill takes less than 2 seconds.     Findings: No rash.  Neurological:     Mental Status: The patient is awake and alert.  ED Results / Procedures / Treatments   Labs (all labs ordered are listed, but only abnormal results are displayed) Labs Reviewed - No data to display   EKG     RADIOLOGY     PROCEDURES:  Critical Care performed:   Procedures   MEDICATIONS ORDERED IN ED: Medications - No data to display   IMPRESSION / MDM / ASSESSMENT AND PLAN / ED COURSE  I reviewed the triage vital signs and the nursing notes.   Differential diagnosis includes, but is not limited to, dental decay, dental caries, pulpitis, abscess.  Patient is awake and alert, hemodynamically stable and afebrile.  Patient was evaluated in the emergency department for dental pain. Patient has obvious missing tooth #14 which is his location of pain.  I suspect some dental caries vs pulpitits. No gingival swelling or fluctuance concerning for gingival abscess.  No trismus, nuchal rigidity, neck pain, hot potato voice, uvular deviation or malocclusion to suggest deep space infection. No sublingual swelling concerning for Ludwig's angina.  Patient was started on antibiotics and chlorhexidine mouth rinse.  Patient was treated symptomatically in the emergency department.  Discussed care plan, return precautions, and advised close outpatient follow-up with dentist. Patient agrees with plan of care.   Patient's presentation is most consistent with acute illness / injury with system symptoms.      FINAL CLINICAL IMPRESSION(S) / ED DIAGNOSES   Final diagnoses:  Dental caries  Pain, dental     Rx / DC Orders   ED Discharge Orders          Ordered    amoxicillin-clavulanate (AUGMENTIN) 875-125 MG tablet  2 times daily        12/10/23 1444    chlorhexidine (PERIDEX) 0.12 % solution  2 times daily        12/10/23 1444             Note:  This document was prepared using Dragon voice recognition software and may include unintentional dictation errors.   Shewanda Sharpe E, PA-C 12/10/23 1447    Ruth Cove, MD 12/10/23 1535

## 2024-03-07 ENCOUNTER — Emergency Department
Admission: EM | Admit: 2024-03-07 | Discharge: 2024-03-07 | Disposition: A | Discharge status: Home / Self Care | Payer: Self-pay | Attending: Emergency Medicine | Admitting: Emergency Medicine

## 2024-03-07 ENCOUNTER — Other Ambulatory Visit: Payer: Self-pay

## 2024-03-07 DIAGNOSIS — Y92410 Unspecified street and highway as the place of occurrence of the external cause: Secondary | ICD-10-CM | POA: Insufficient documentation

## 2024-03-07 DIAGNOSIS — M545 Low back pain, unspecified: Secondary | ICD-10-CM | POA: Diagnosis present

## 2024-03-07 DIAGNOSIS — M25512 Pain in left shoulder: Secondary | ICD-10-CM | POA: Diagnosis not present

## 2024-03-07 DIAGNOSIS — M25511 Pain in right shoulder: Secondary | ICD-10-CM | POA: Diagnosis not present

## 2024-03-07 DIAGNOSIS — Z720 Tobacco use: Secondary | ICD-10-CM | POA: Insufficient documentation

## 2024-03-07 DIAGNOSIS — M7918 Myalgia, other site: Secondary | ICD-10-CM

## 2024-03-07 DIAGNOSIS — M542 Cervicalgia: Secondary | ICD-10-CM | POA: Insufficient documentation
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
## 2024-04-27 ENCOUNTER — Emergency Department
Admission: EM | Admit: 2024-04-27 | Discharge: 2024-04-27 | Disposition: A | Discharge status: Home / Self Care | Payer: Self-pay | Attending: Emergency Medicine | Admitting: Emergency Medicine

## 2024-04-27 ENCOUNTER — Other Ambulatory Visit: Payer: Self-pay

## 2024-04-27 DIAGNOSIS — Z720 Tobacco use: Secondary | ICD-10-CM | POA: Insufficient documentation

## 2024-04-27 DIAGNOSIS — E871 Hypo-osmolality and hyponatremia: Secondary | ICD-10-CM | POA: Insufficient documentation

## 2024-04-27 DIAGNOSIS — R112 Nausea with vomiting, unspecified: Secondary | ICD-10-CM | POA: Insufficient documentation

## 2024-04-27 LAB — CBC
HCT: 42.7 % (ref 39.0–52.0)
Hemoglobin: 14.6 g/dL (ref 13.0–17.0)
MCH: 30.7 pg (ref 26.0–34.0)
MCHC: 34.2 g/dL (ref 30.0–36.0)
MCV: 89.7 fL (ref 80.0–100.0)
Platelets: 351 K/uL (ref 150–400)
RBC: 4.76 MIL/uL (ref 4.22–5.81)
RDW: 12.3 % (ref 11.5–15.5)
WBC: 7.6 K/uL (ref 4.0–10.5)
nRBC: 0 % (ref 0.0–0.2)

## 2024-04-27 LAB — COMPREHENSIVE METABOLIC PANEL WITH GFR
ALT: 26 U/L (ref 0–44)
AST: 46 U/L — ABNORMAL HIGH (ref 15–41)
Albumin: 4.6 g/dL (ref 3.5–5.0)
Alkaline Phosphatase: 80 U/L (ref 38–126)
Anion gap: 14 (ref 5–15)
BUN: 14 mg/dL (ref 6–20)
CO2: 22 mmol/L (ref 22–32)
Calcium: 8.8 mg/dL — ABNORMAL LOW (ref 8.9–10.3)
Chloride: 98 mmol/L (ref 98–111)
Creatinine, Ser: 0.82 mg/dL (ref 0.61–1.24)
GFR, Estimated: >60 mL/min (ref >60)
Glucose, Bld: 79 mg/dL (ref 70–99)
Potassium: 4.3 mmol/L (ref 3.5–5.1)
Sodium: 134 mmol/L — ABNORMAL LOW (ref 135–145)
Total Bilirubin: 0.6 mg/dL (ref 0.0–1.2)
Total Protein: 8 g/dL (ref 6.5–8.1)

## 2024-04-27 LAB — URINALYSIS, ROUTINE W REFLEX MICROSCOPIC
Bacteria, UA: NONE SEEN
Bilirubin Urine: NEGATIVE
Glucose, UA: NEGATIVE mg/dL
Ketones, ur: 80 mg/dL — AB
Leukocytes,Ua: NEGATIVE
Nitrite: NEGATIVE
Protein, ur: 100 mg/dL — AB
Specific Gravity, Urine: 1.027 (ref 1.005–1.030)
pH: 5 (ref 5.0–8.0)

## 2024-04-27 LAB — LIPASE, BLOOD: Lipase: 26 U/L (ref 11–51)

## 2024-04-27 LAB — CK: Total CK: 193 U/L (ref 49–397)

## 2024-04-27 MED ORDER — SODIUM CHLORIDE 0.9 % IV BOLUS
1000.0000 mL | Freq: Once | INTRAVENOUS | Status: AC
  Administered 2024-04-27: 1000 mL via INTRAVENOUS

## 2024-04-27 MED ORDER — ONDANSETRON HCL 4 MG/2ML IJ SOLN
4.0000 mg | Freq: Once | INTRAMUSCULAR | Status: AC
  Administered 2024-04-27: 4 mg via INTRAVENOUS
  Filled 2024-04-27: qty 2

## 2024-04-27 NOTE — Discharge Instructions (Signed)
 Your blood work is reassuring.  You were given IV fluids and nausea medicines to help with your symptoms.  Please follow-up with your outpatient provider.  Please return for any new, worsening, or changing symptoms or other concerns.  It was a pleasure caring for you today.

## 2024-04-27 NOTE — ED Provider Notes (Signed)
 [facility] Provider Note    Event Date/Time   First MD Initiated Contact with Patient 04/27/24 [phone]     (approximate)   History   Emesis   HPI  Derek Gardner is a 33 y.o. male who presents today for evaluation of nausea and vomiting after  drinking a lot of alcohol this weekend.  Patient reports that he drank approximately half a gallon of tequila.  He reports that he does not normally drink this much.  He also reports that he smokes marijuana.  No other substance use.  He denies abdominal pain.  He denies hematemesis.  He denies chest pain.  No coffee-ground emesis.  Patient Active Problem List   Diagnosis Date Noted   Severe recurrent major depression without psychotic features (HCC) 09/07/2015   Alcohol use disorder, mild, abuse 09/07/2015   Suicidal ideation 09/07/2015   Tobacco use disorder 09/07/2015          Physical Exam   Triage Vital Signs: ED Triage Vitals [04/27/24 0842]  Encounter Vitals Group     BP 115/62     Girls Systolic BP Percentile      Girls Diastolic BP Percentile      Boys Systolic BP Percentile      Boys Diastolic BP Percentile      Pulse Rate 75     Resp 18     Temp 98 F (36.7 C)     Temp src      SpO2 98 %     Weight 148 lb (67.1 kg)     Height 5' 11 (1.803 m)     Head Circumference      Peak Flow      Pain Score 5     Pain Loc      Pain Education      Exclude from Growth Chart     Most recent vital signs: Vitals:   04/27/24 0842  BP: 115/62  Pulse: 75  Resp: 18  Temp: 98 F (36.7 C)  SpO2: 98%    Physical Exam Vitals and nursing note reviewed.  Constitutional:      General: Awake and alert. No acute distress.    Appearance: Normal appearance. The patient is normal weight.  HENT:     Head: Normocephalic and atraumatic.     Mouth: Mucous membranes are moist.  Eyes:     General: PERRL. Normal EOMs        Right eye: No discharge.        Left eye: No discharge.     Conjunctiva/sclera:  Conjunctivae normal.  Cardiovascular:     Rate and Rhythm: Normal rate and regular rhythm.     Pulses: Normal pulses.  Pulmonary:     Effort: Pulmonary effort is normal. No respiratory distress.     Breath sounds: Normal breath sounds.  Abdominal:     Abdomen is soft. There is no abdominal tenderness. No rebound or guarding. No distention. Musculoskeletal:        General: No swelling. Normal range of motion.     Cervical back: Normal range of motion and neck supple.  Skin:    General: Skin is warm and dry.     Capillary Refill: Capillary refill takes less than 2 seconds.     Findings: No rash.  Neurological:     Mental Status: The patient is awake and alert.      ED Results / Procedures / Treatments   Labs (all labs ordered  are listed, but only abnormal results are displayed) Labs Reviewed  COMPREHENSIVE METABOLIC PANEL WITH GFR - Abnormal; Notable for the following components:      Result Value   Sodium 134 (*)    Calcium 8.8 (*)    AST 46 (*)    All other components within normal limits  URINALYSIS, ROUTINE W REFLEX MICROSCOPIC - Abnormal; Notable for the following components:   Color, Urine YELLOW (*)    APPearance CLEAR (*)    Hgb urine dipstick MODERATE (*)    Ketones, ur 80 (*)    Protein, ur 100 (*)    All other components within normal limits  LIPASE, BLOOD  CBC  CK     EKG     RADIOLOGY     PROCEDURES:  Critical Care performed:   Procedures   MEDICATIONS ORDERED IN ED: Medications  sodium chloride 0.9 % bolus 1,000 mL (0 mLs Intravenous Stopped 04/27/24 1034)  ondansetron (ZOFRAN) injection 4 mg (4 mg Intravenous Given 04/27/24 0914)     IMPRESSION / MDM / ASSESSMENT AND PLAN / ED COURSE  I reviewed the triage vital signs and the nursing notes.   Differential diagnosis includes, but is not limited to, alcohol use, gastroenteritis, electrolyte disarray, dehydration.  No chest pain to suggest Boerhaave's syndrome.  No hematemesis.  No  coffee-ground emesis.  Labs obtained are overall reassuring.  He does not appear to be clinically dehydrated, and his BUN to creatinine ratio is reassuring.  His creatinine is within normal limits.  His CPK is also within normal limits.  He does have mild ketones in his urine, likely due to his vomiting.  His abdomen is soft and nontender throughout, I do not suspect intra-abdominal process.  Lipase is normal.  Patient was treated symptomatically with IV fluids and Zofran.  Discussed resources for alcohol use, though patient declines.  He was able to tolerate p.o. challenge.  We discussed that he should decrease his alcohol consumption, we also discussed bland diet and return precautions.  Patient understands and agrees with plan.  He was discharged in stable condition.  Patient's presentation is most consistent with acute complicated illness / injury requiring diagnostic workup.  Clinical Course as of 04/27/24 1338  Mon Apr 27, 2024  1027 Patient is able to tolerate p.o., had crackers and water and reports feeling significantly improved and ready to go home [JP]    Clinical Course User Index [JP] Ardie Mclennan E, PA-C     FINAL CLINICAL IMPRESSION(S) / ED DIAGNOSES   Final diagnoses:  Nausea and vomiting, unspecified vomiting type     Rx / DC Orders   ED Discharge Orders     None        Note:  This document was prepared using Dragon voice recognition software and may include unintentional dictation errors.   Lilliana Turner E, PA-C 04/27/24 1338    Levander Slate, MD 04/27/24 [phone]

## 2024-04-27 NOTE — ED Triage Notes (Signed)
 Pt come with vomiting that started yesterday. Pt states no belly pain. Pt states he has just felt fatigued. Pt states he thinks he might be dehydrated. Pt states some chills and denies fevers.
